# Patient Record
Sex: Male | Born: 1955 | Race: White | Hispanic: No | Marital: Married
Health system: Southern US, Community
[De-identification: ages and names within clinical notes are randomized; demographics above are authoritative.]

---

## 1999-11-24 ENCOUNTER — Encounter: Payer: Self-pay | Admitting: Orthopedic Surgery

## 1999-11-24 ENCOUNTER — Encounter: Payer: Self-pay | Admitting: Emergency Medicine

## 1999-11-26 ENCOUNTER — Inpatient Hospital Stay (HOSPITAL_COMMUNITY): Admission: EM | Admit: 1999-11-26 | Discharge: 1999-11-29 | Payer: Self-pay | Admitting: Emergency Medicine

## 1999-11-27 ENCOUNTER — Encounter: Payer: Self-pay | Admitting: Orthopedic Surgery

## 2000-09-13 ENCOUNTER — Encounter: Payer: Self-pay | Admitting: Orthopedic Surgery

## 2000-09-13 ENCOUNTER — Ambulatory Visit (HOSPITAL_COMMUNITY): Admission: RE | Admit: 2000-09-13 | Discharge: 2000-09-13 | Payer: Self-pay | Admitting: Orthopedic Surgery

## 2000-10-10 ENCOUNTER — Encounter: Payer: Self-pay | Admitting: Orthopedic Surgery

## 2000-10-10 ENCOUNTER — Encounter: Admission: RE | Admit: 2000-10-10 | Discharge: 2000-10-10 | Payer: Self-pay | Admitting: Orthopedic Surgery

## 2005-11-21 ENCOUNTER — Emergency Department (HOSPITAL_COMMUNITY): Admission: EM | Admit: 2005-11-21 | Discharge: 2005-11-22 | Payer: Self-pay | Admitting: Emergency Medicine

## 2006-03-17 ENCOUNTER — Emergency Department (HOSPITAL_COMMUNITY): Admission: EM | Admit: 2006-03-17 | Discharge: 2006-03-18 | Payer: Self-pay | Admitting: Emergency Medicine

## 2006-03-30 ENCOUNTER — Ambulatory Visit (HOSPITAL_COMMUNITY): Admission: RE | Admit: 2006-03-30 | Discharge: 2006-03-30 | Payer: Self-pay | Admitting: Plastic Surgery

## 2006-08-20 ENCOUNTER — Ambulatory Visit: Payer: Self-pay | Admitting: Family Medicine

## 2006-08-22 ENCOUNTER — Ambulatory Visit (HOSPITAL_COMMUNITY): Admission: RE | Admit: 2006-08-22 | Discharge: 2006-08-22 | Payer: Self-pay | Admitting: Internal Medicine

## 2006-08-27 ENCOUNTER — Ambulatory Visit: Payer: Self-pay | Admitting: Family Medicine

## 2006-08-28 ENCOUNTER — Ambulatory Visit: Payer: Self-pay | Admitting: *Deleted

## 2006-10-09 ENCOUNTER — Ambulatory Visit: Payer: Self-pay | Admitting: Family Medicine

## 2007-03-22 ENCOUNTER — Ambulatory Visit: Payer: Self-pay | Admitting: Family Medicine

## 2007-03-22 ENCOUNTER — Encounter (INDEPENDENT_AMBULATORY_CARE_PROVIDER_SITE_OTHER): Payer: Self-pay | Admitting: Internal Medicine

## 2007-03-22 ENCOUNTER — Ambulatory Visit (HOSPITAL_COMMUNITY): Admission: RE | Admit: 2007-03-22 | Discharge: 2007-03-22 | Payer: Self-pay | Admitting: Family Medicine

## 2007-03-22 LAB — CONVERTED CEMR LAB
Basophils Relative: 1 % (ref 0–1)
Eosinophils Absolute: 0.2 10*3/uL (ref 0.0–0.7)
Eosinophils Relative: 3 % (ref 0–5)
HCT: 42.5 % (ref 39.0–52.0)
Lymphs Abs: 2.6 10*3/uL (ref 0.7–3.3)
MCHC: 32.7 g/dL (ref 30.0–36.0)
MCV: 88.5 fL (ref 78.0–100.0)
Monocytes Absolute: 0.9 10*3/uL — ABNORMAL HIGH (ref 0.2–0.7)
Monocytes Relative: 10 % (ref 3–11)
Neutrophils Relative %: 56 % (ref 43–77)
RBC: 4.8 M/uL (ref 4.22–5.81)
WBC: 8.6 10*3/uL (ref 4.0–10.5)

## 2007-04-22 ENCOUNTER — Encounter (INDEPENDENT_AMBULATORY_CARE_PROVIDER_SITE_OTHER): Payer: Self-pay | Admitting: Family Medicine

## 2007-04-22 ENCOUNTER — Ambulatory Visit: Payer: Self-pay | Admitting: Internal Medicine

## 2007-04-22 LAB — CONVERTED CEMR LAB: Valproic Acid Lvl: 55.7 ug/mL (ref 50.0–100.0)

## 2007-05-01 ENCOUNTER — Encounter (INDEPENDENT_AMBULATORY_CARE_PROVIDER_SITE_OTHER): Payer: Self-pay | Admitting: *Deleted

## 2007-06-13 DIAGNOSIS — E669 Obesity, unspecified: Secondary | ICD-10-CM

## 2007-06-13 DIAGNOSIS — R569 Unspecified convulsions: Secondary | ICD-10-CM

## 2007-06-13 DIAGNOSIS — S82899A Other fracture of unspecified lower leg, initial encounter for closed fracture: Secondary | ICD-10-CM

## 2008-01-22 ENCOUNTER — Ambulatory Visit: Payer: Self-pay | Admitting: Cardiology

## 2008-01-22 ENCOUNTER — Ambulatory Visit: Payer: Self-pay | Admitting: Internal Medicine

## 2008-01-22 ENCOUNTER — Inpatient Hospital Stay (HOSPITAL_COMMUNITY): Admission: AC | Admit: 2008-01-22 | Discharge: 2008-01-30 | Payer: Self-pay

## 2008-01-23 ENCOUNTER — Encounter (INDEPENDENT_AMBULATORY_CARE_PROVIDER_SITE_OTHER): Payer: Self-pay | Admitting: Internal Medicine

## 2008-01-30 ENCOUNTER — Encounter: Payer: Self-pay | Admitting: Critical Care Medicine

## 2008-02-11 ENCOUNTER — Ambulatory Visit: Payer: Self-pay | Admitting: Internal Medicine

## 2010-04-30 IMAGING — CT CT HEAD W/O CM
1 series · 16 of 30 positions shown, 20 images · non-contrast
Comparison: 01/22/2008

CLINICAL DATA: Seizure.  Possible head trauma.  Ventilator support.

CT HEAD WITHOUT CONTRAST
TECHNIQUE: Contiguous axial images were obtained from the base of
the skull through the vertex without contrast.

[Series 2: head routine 4.8 h37s · axial · 0.43mm/px · z∈[-109,+22]mm · 16 of 30 slices shown, 20 images]
[im 2/30  brain]
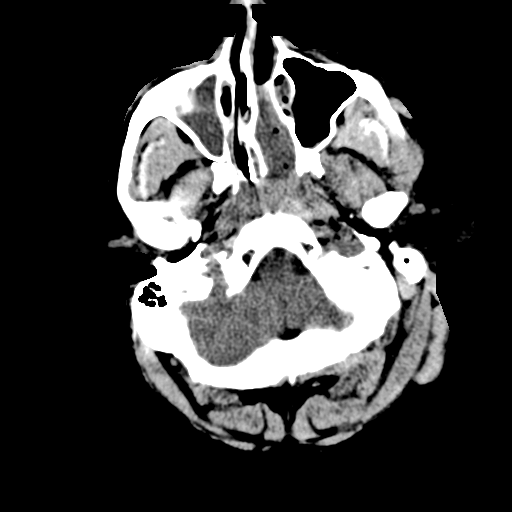
[im 2/30  bone]
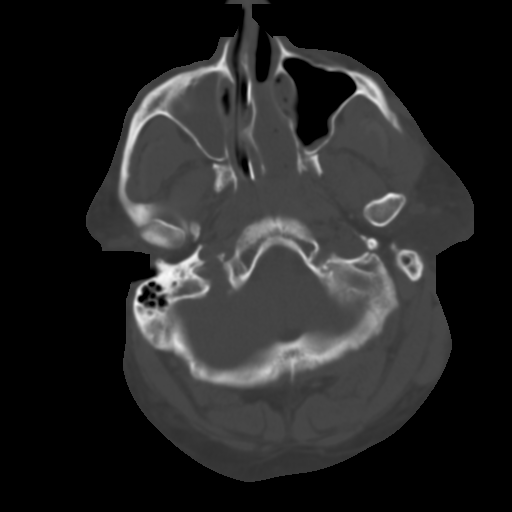
[im 4/30  brain]
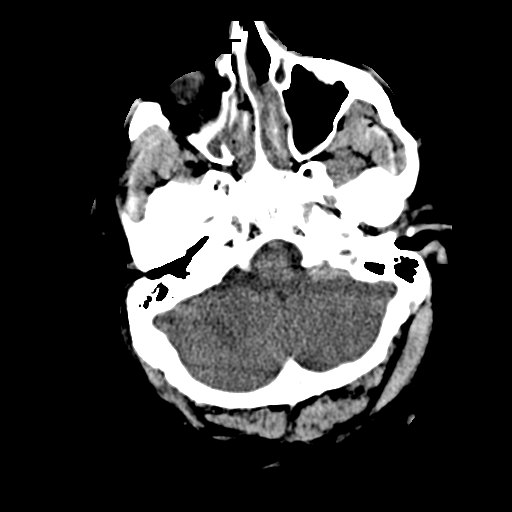
[im 6/30  brain]
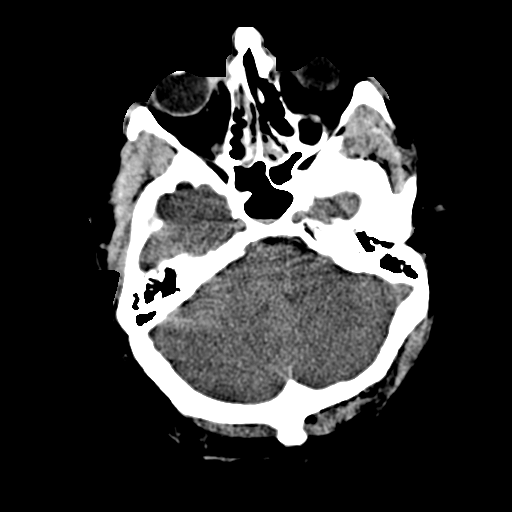
[im 8/30  brain]
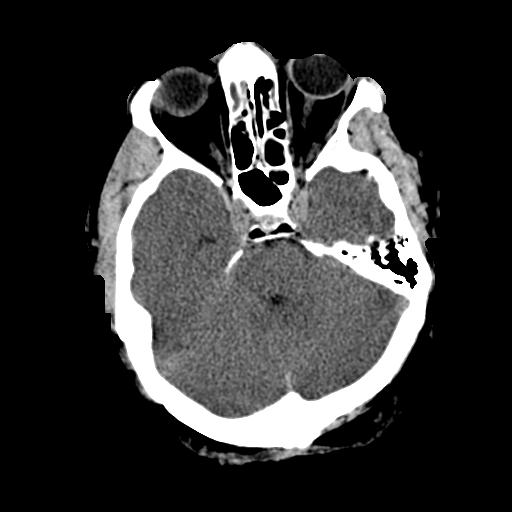
[im 9/30  brain]
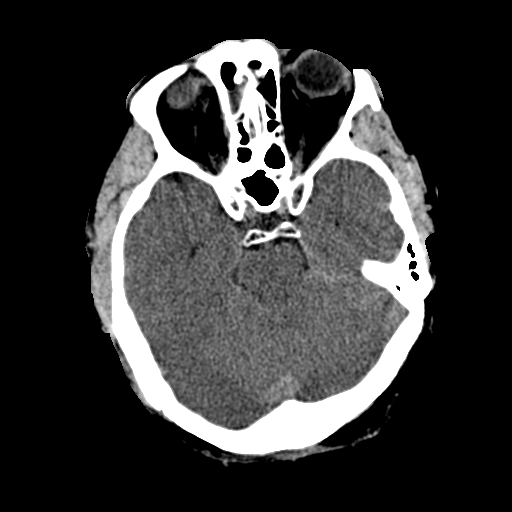
[im 9/30  bone]
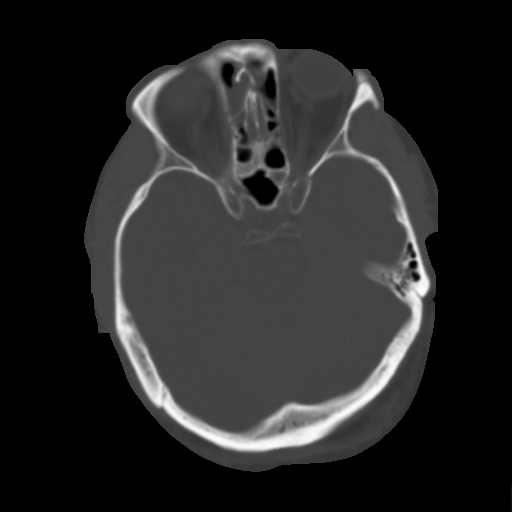
[im 11/30  brain]
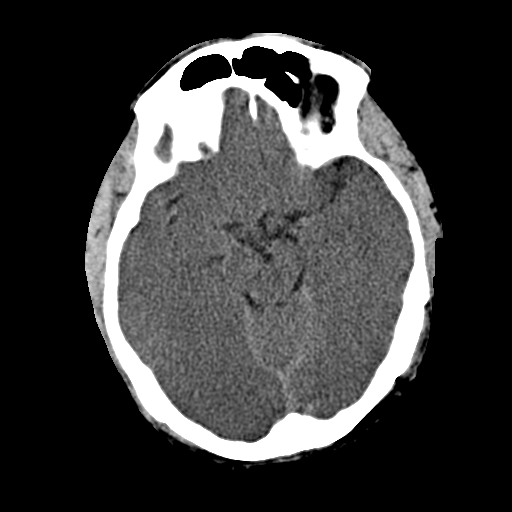
[im 13/30  brain]
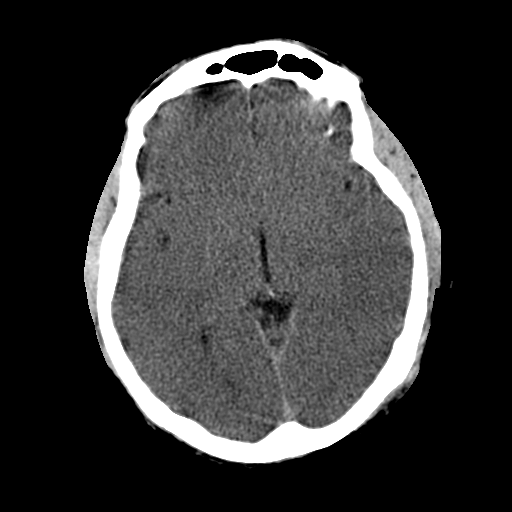
[im 15/30  brain]
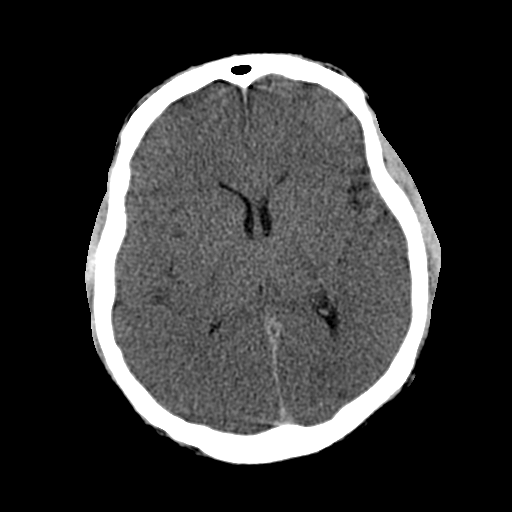
[im 16/30  brain]
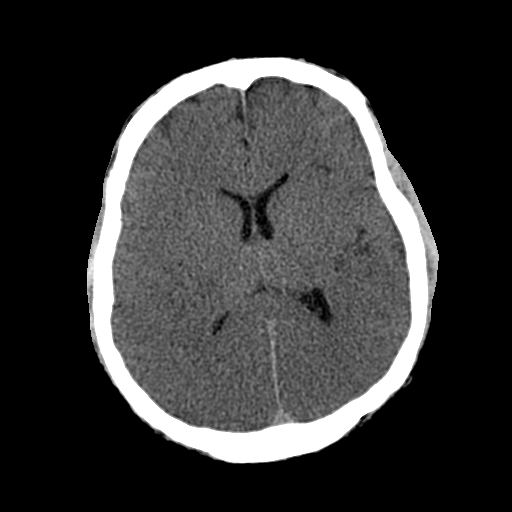
[im 16/30  bone]
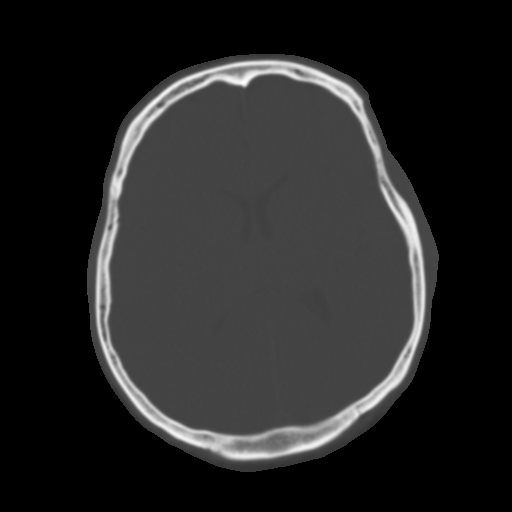
[im 18/30  brain]
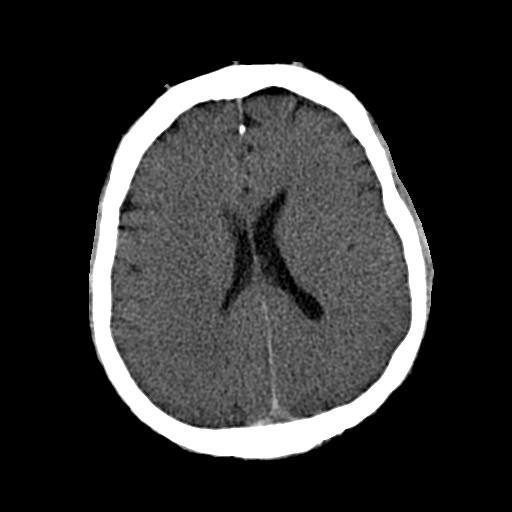
[im 20/30  brain]
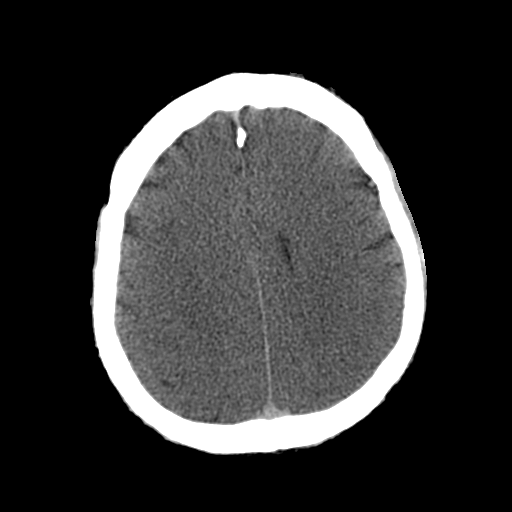
[im 22/30  brain]
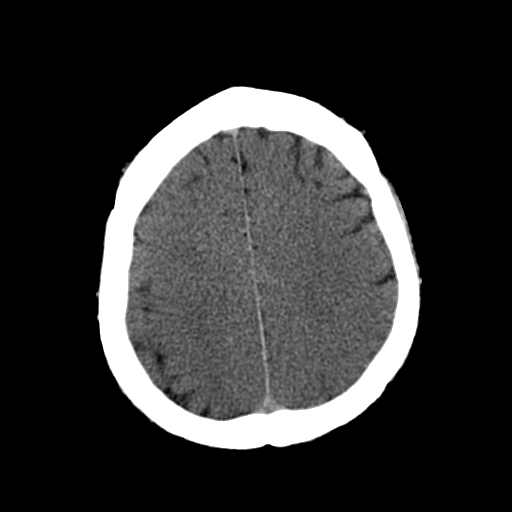
[im 23/30  brain]
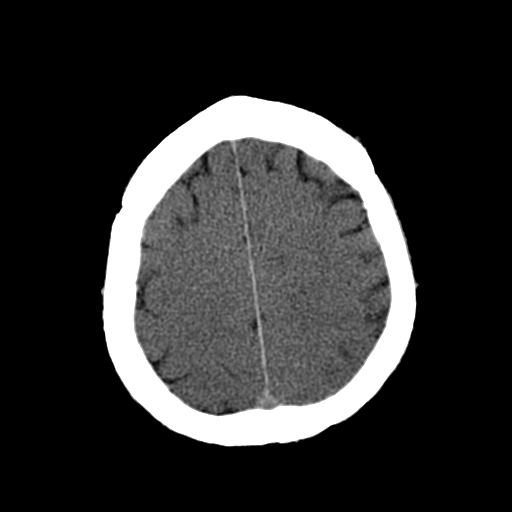
[im 23/30  bone]
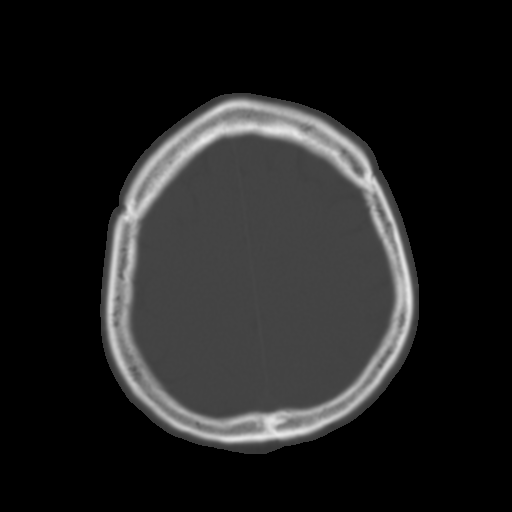
[im 25/30  brain]
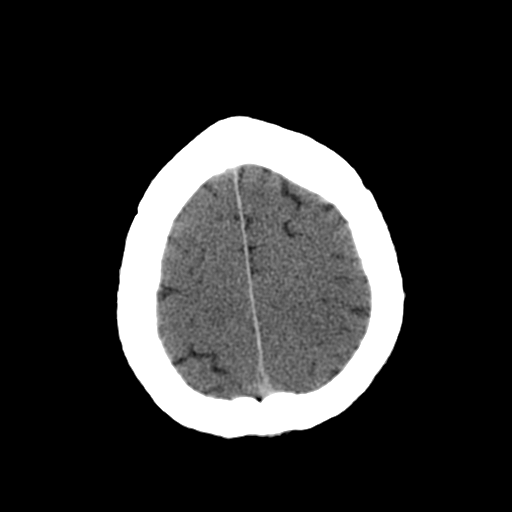
[im 27/30  brain]
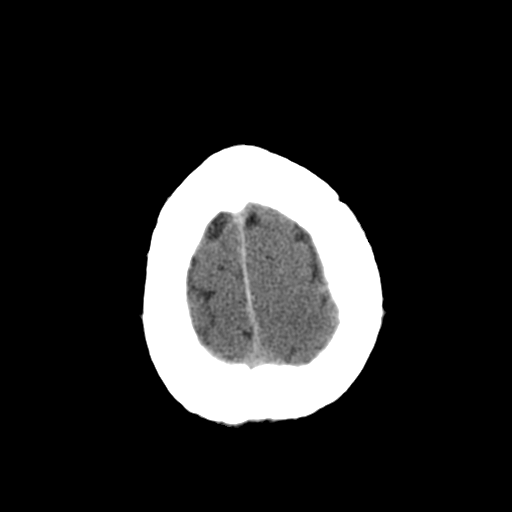
[im 29/30  brain]
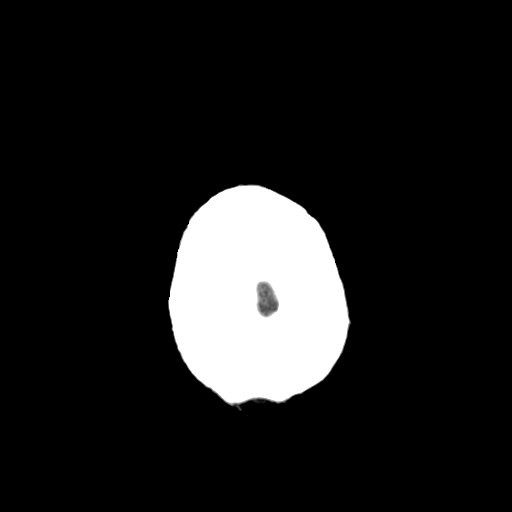

[16 of 30 positions shown; findings below may reference images not displayed]

FINDINGS: There is the possibility of diffuse brain swelling.  I do
not see focal infarction, mass lesion, hemorrhage, hydrocephalus or
extra-axial collection.  Fluid is present in the right maxillary
and ethmoid sinuses.
IMPRESSION: Suspicion of diffusely swollen brain.  This raises the possibility
of diffuse anoxic injury.

## 2010-05-01 IMAGING — CR DG CHEST 1V PORT
1 series · 1 of 1 positions shown · non-contrast
Comparison: 01/23/2008.

CLINICAL DATA: Seizure.  Respiratory failure.

PORTABLE CHEST - 1 VIEW

[view not recorded]
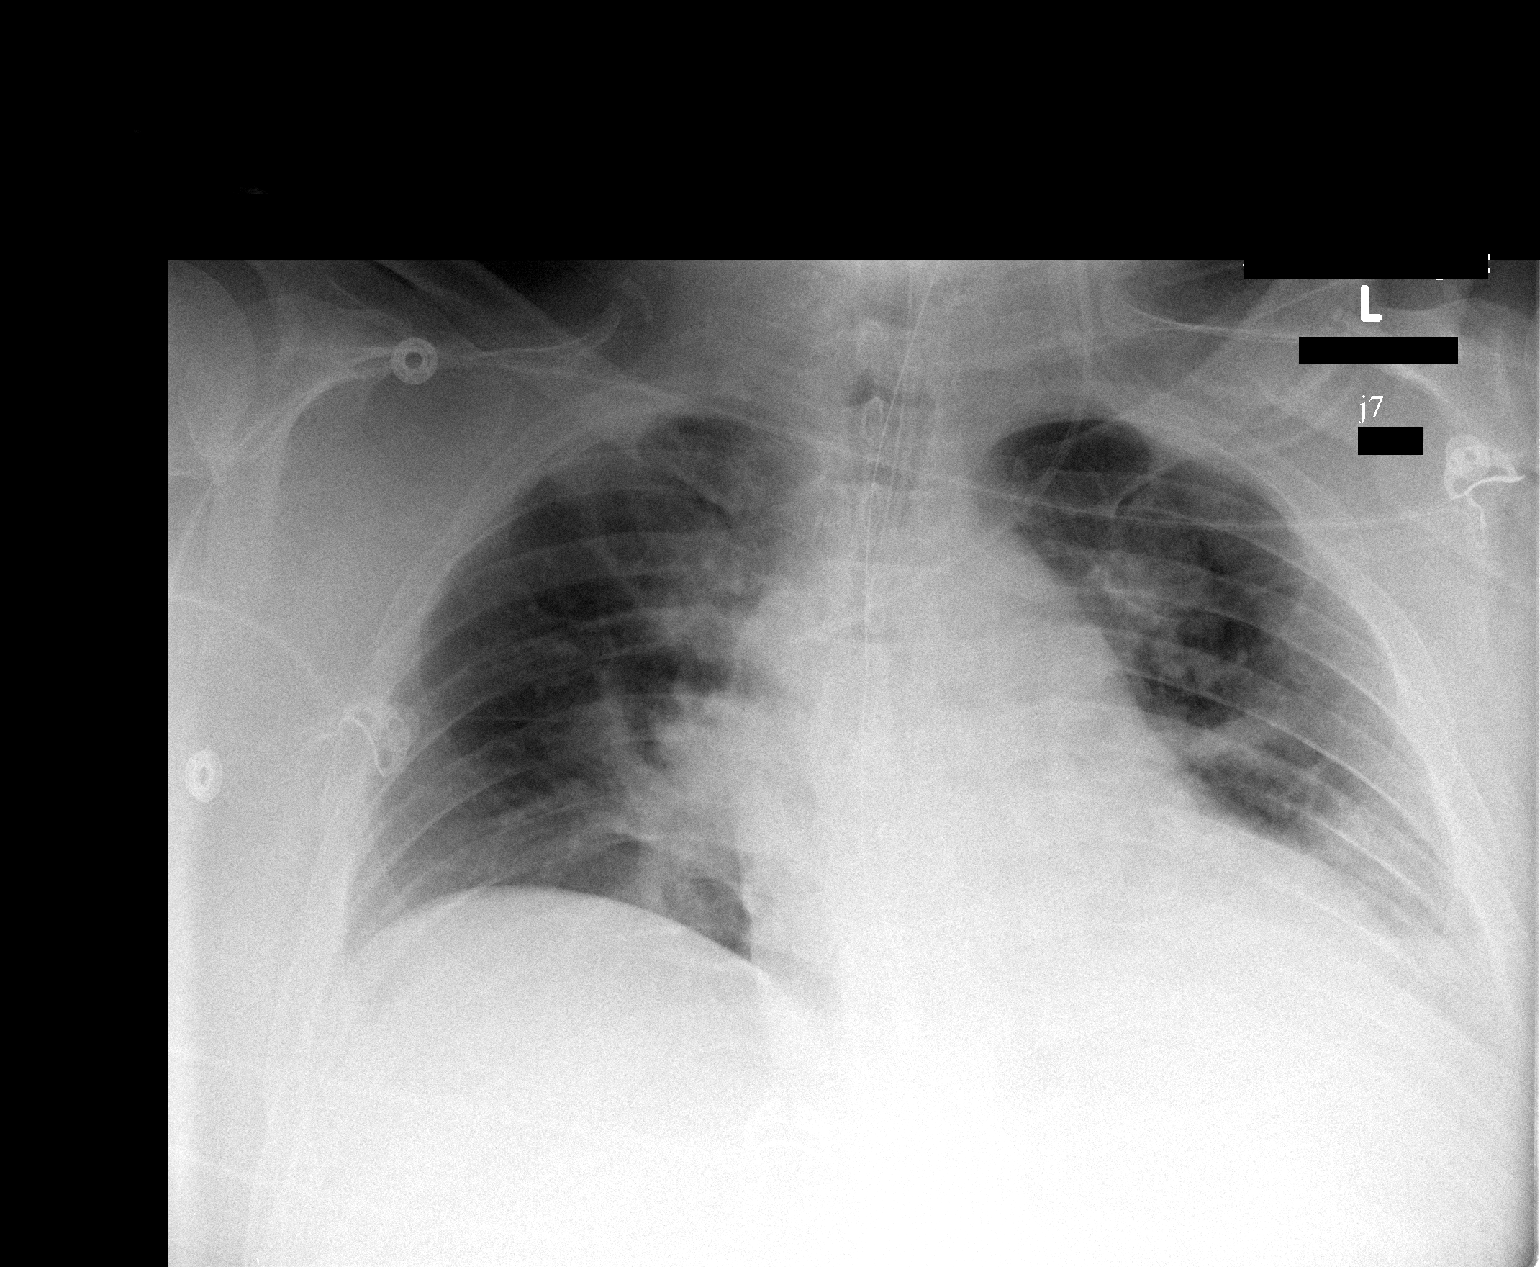

[1 of 1 positions shown; findings below may reference images not displayed]

FINDINGS: Endotracheal tube tip 2.5 cm above the carina.
Cardiomegaly.  Pulmonary vascular congestion.  Subsegmental
atelectatic changes left midlung zone, right perihilar region and
left base.  Limit evaluation of the left base. Left central line
tip proximal superior vena cava.  Nasogastric tube courses below
the diaphragm.
IMPRESSION: Poor inspiration with cardiomegaly and central pulmonary vascular
prominence.

Scattered subsegmental atelectatic changes.

## 2010-05-06 IMAGING — CR DG CHEST 1V PORT
1 series · 1 of 1 positions shown · non-contrast
Comparison: 01/25/2008.

CLINICAL DATA: Seizure, respiratory failure.

PORTABLE CHEST - 1 VIEW

[view not recorded]
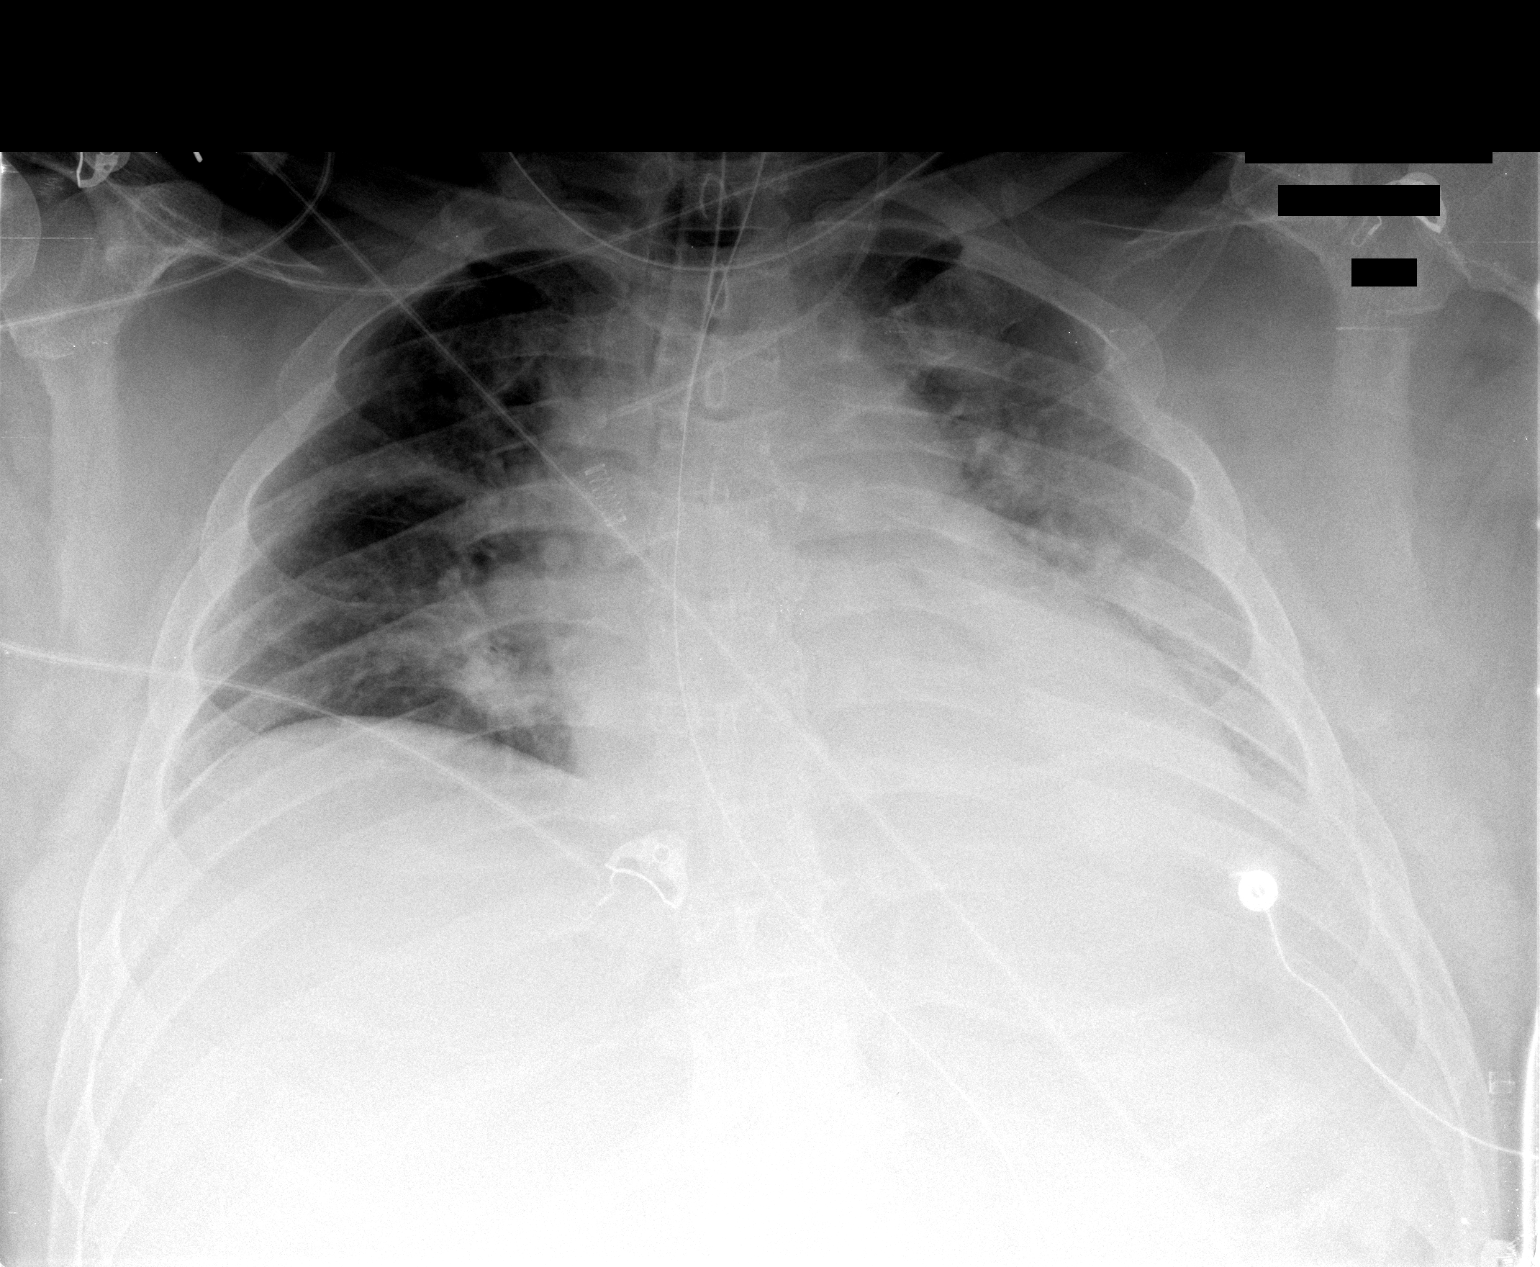

[1 of 1 positions shown; findings below may reference images not displayed]

FINDINGS: Endotracheal tube terminates approximately 1.8 cm above
the carina.  Nasogastric tube is followed into the stomach with the
tip projecting beyond the inferior boundary the film.  Left IJ
central line is at the junction of the brachiocephalic veins.
Lungs are low in volume with persistent bilateral air space
disease.
IMPRESSION: 1.  Low lung volumes with persistent bilateral air space disease,
possibly representing edema.

## 2010-12-27 NOTE — H&P (Signed)
NAME:  Walter Marks, FANT NO.:  [id number]   MEDICAL RECORD NO.:  [id number]          PATIENT TYPE:  INP   LOCATION:  3102                         FACILITY:  [facility]   PHYSICIAN:  Nelda Bucks, MD DATE OF BIRTH:  07/30/1956   DATE OF ADMISSION:  01/22/2008  DATE OF DISCHARGE:                              HISTORY & PHYSICAL   CHIEF COMPLAINT:  Seizure, respiratory failure, status post cardiac  arrest.   HISTORY OF PRESENT ILLNESS:  This is primarily obtained through  emergency room records, and discussion with emergency room physician,  Dr. Bernette Mayers, who obtained primary history from wife.  Apparently, the  patient was in his usual state of health with some complaint of chronic  leg pain until last night.  This morning, the mother heard the patient  fall at home.  She found him unconscious and seizing on the floor.  She  called EMS.  Upon EMS arrival, the patient was apneic and asystolic.  He  was intubated, given 2 doses of epinephrine intravenously, and  plus/minus chest compressions were obtained; however, records at this  time do not verify whether or not he required chest compression.  There  was return of spontaneous circulation, again we are unclear about how  long the patient was asystolic, the estimated time from which the  patient was found seizing and upon arrival to EMS was 10 units.  Upon  arrival in the emergency room, the patient was found to be hypotensive,  continuing to actively have seizure versus myoclonus, and therefore was  treated with IV benzodiazepines in the form of 4 mg of Ativan and 6 mg  of Versed.  He did receive a neuromuscular blockade to facilitate CT of  head.  Upon Pulmonary Critical Care arrival, the patient has just  returned from CT scan which is negative for acute hemorrhage or other  abnormal findings with the exception of sinusitis.  Upon arrival, the  patient had active lower extremity and upper extremity twitching,  facial  twitch, and persistent hypotension.  The Pulmonary Critical Care team  was asked to evaluate and assume care.   PAST MEDICAL HISTORY:  Seizure disorder only.  The last EEG we have was  read as normal in January 2008.   MEDICATIONS:  Currently listed are Depakote.  No other medications.   ALLERGIES:  No known drug allergies.   FAMILY HISTORY:  Not available currently.   REVIEW OF SYSTEMS:  He is unresponsive, on full ventilatory support, and  appears to be actively seizing.   PHYSICAL EXAMINATION:  VITAL SIGNS:  Temperature is pending, heart rate  116, blood pressure 111/57 on dopamine drip, respirations 16, and  saturations 90s.  GENERAL: This is an unresponsive, morbidly obese white male patient  currently on full ventilatory support and actively seizing.  HEENT:  His pupils are equal and reactive to light.  He has no JVD.  He  is orally intubated, mucous membranes are moist.  PULMONARY:  Notable  for scattered rhonchi.  Currently on full ventilatory support.  Tidal  volume 650, respirations 16, and minute  ventilation with this is  approximately 10 L per minute.  PEEP is 5, FIO2 50%, peak inspiratory  pressure is 32+, and expiratory pressure is 24.  CARDIAC:  Regular rate and rhythm.  ABDOMEN:  Soft and nontender.  NEUROLOGIC:  Unresponsive, appears to be actively seizing versus having  active myoclonus.  GU:  Has Foley catheter in place.   LABORATORY DATA:  Sodium 138, potassium 4.5, chloride 106, CO2 10, BUN  23, creatinine 1.32, and glucose 349.  White blood cell count 17.5,  hemoglobin 12.3, hematocrit 37.1, and platelet count 258.  PTT 35 and  PT/INR is 16.2/1.3.  Valproic acid is 21.1, Dilantin level is less than  2.5, and aspirin less than 4.  Chest x-ray demonstrates chest x-ray in  good position.  No clear infiltrates.   IMPRESSION AND PLAN:  1. This is that of status epilepticus with known history of seizure      disorder.  Neurology has been called.   Certainly, given cardiac      arrest, there is worry for anoxic injury contributing to what also      might be active myoclonus instead of actual seizure disorder.  Plan      for this is to initiate Versed drip, lower valproic acid, obtain      STAT EEG, check CKs and myoglobin, and consider repeat CT of head      should seizures persist.  Given altered mental status, and active      shock state, and requiring vasoactive drips, we will forego lumbar      puncture at this time, and treat empirically with antibiotics to      cover potentially for meningitis, however, at low clinical      suspicion.  2. Status post cardiopulmonary arrest, unclear of downtime.  The      initial cardiac rhythm was asystole, has had persistent shock state      since this, requiring vasoactive support.  He did have a transient      junctional rhythm status post arrival to the emergency room.      Certainly, additional potential causes could be acute coronary      artery syndrome, also would question whether or not there could      have been acute pulmonary emboli given the patient did complain of      leg pain the night prior, although not clinically high suspicion,      would worry about deep vein thrombosis.  Therefore, plan is to      check cardiac enzymes, check BNP, and check 12-lead again.  I have      ordered echocardiogram to evaluate right ventricular strain.  We      will check bilateral lower extremity Dopplers, obtain CT of chest      to rule out PE, place central venous catheter, and aggressively      hydrate for goal-directed therapy.  At this time, probable      metabolic status is contributing to hemodynamic compromise.  We      will obtain arterial blood gas, check ABG, and resuscitate as      indicated in the intensive care.  3. Hyperglycemia.  Plan for this is to initiate hyperglycemia      protocol.  4. Acute respiratory failure, secondary to status epilepticus and      cardiopulmonary  arrest.  Currently, no clear evidence of pneumonia,      but high risk for aspiration.  Therefore, we  will check      bronchioalveolar lavage, continue full support, consider      bronchodilators, and follow up arterial blood gas and chest x-ray.  5. Metabolic acidosis, likely secondary to hypoperfusion state.  Plan      for this is to support end-organ perfusion, hydrate, check lactic      acid and arterial blood gas as well as repeat electrolyte panel.  6. Leukocytosis, question serous versus sepsis.  Does have      radiographic evidence of sinusitis, however, no other clear source      of infection.  We are going to empirically cover for potential      meningitis at low clinical suspicion given history of active      seizure disorder.  In anyway, plan will be to blood culture x2,      obtain bronchioalveolar lavage, and urine culture.  We will      empirically cover with ceftriaxone 4 tablets and vancomycin.  Best      practice plan for this is to place the patient on subcu heparin and      proton pump inhibitors.      Zenia Resides, NP      Nelda Bucks, MD  Electronically Signed    PB/MEDQ  D:  01/22/2008  T:  01/23/2008  Job:  [id number]

## 2010-12-27 NOTE — Consult Note (Signed)
NAME:  Walter Marks, Walter Marks NO.:  [id number]   MEDICAL RECORD NO.:  [id number]          PATIENT TYPE:  INP   LOCATION:  3102                         FACILITY:  [facility]   PHYSICIAN:  Wilson Singer, M.D.DATE OF BIRTH:  05-04-1956   DATE OF CONSULTATION:  01/27/2008  DATE OF DISCHARGE:                                 CONSULTATION   REFERRING PHYSICIAN:  Mcarthur Rossetti. Tyson Alias, MD, Critical Care.   CONSULTING PHYSICIAN:  Wilson Singer, MD   IMPRESSION:  1. 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  2. Palliative performance score 10%.  3. Prognosis very poor for survival and/poor neurological recovery.   RECOMMENDATIONS:  1. I discussed the pathophysiology of current condition at length and      families, especially and sister and mother, belief systems.  2. Continue full/active treatment for the time being, and I will      continue to follow up.  The wife has a legal right to make final      medical decisions.   HISTORY:  This is a 55 year old man who had a seizure approximately 5  days ago followed by asystolic arrest and associated anoxic brain  injury.  The mother who was at home relates that on the day of admission  at 2:40 p.m. the patient was talking on the phone suddenly she heard a  cessation of the conversation and a noise, which led her to believe that  he had fallen.  When she went to see him in his room, he was unconscious  blue in the face and initially breathing but then soon after stopped  breathing.  She had immediately called 911 and the paramedics arrived  and started cardiopulmonary resuscitation.  She believes that it was a  good 10 minutes or so prior to the paramedics arriving at the scene and  from the medical records it appears that he continued to have status  epilepticus at the home and also in the emergency room.  A CT scanning  and EEG confirms the presence of significant cerebral  edema and brain  wave activity, which is consistent with severe brain injury.  Neurologists have reviewed the patient's face and feel that the patient  has a very poor prognosis and Dr. Orlin Hilding, in particular feels that this  is not a survivable condition.  He is currently on a mechanical  ventilator and has nasogastric tube feeding and is currently on  intervenous fluid support, antibiotic support, and pressors as required.  A CT scan and chest x-rays are suggestive also of infiltrates in lung  fields consistent with pneumonia.  We are now asked to consult in  regarding goals of care.   PAST MEDICAL HISTORY:  1. Previous history of seizure disorders since the age of 80 and the      patient apparently had been probably noncompliant with medications      according to the mother.  2. No other significant history of note.   CURRENT MEDICATIONS:  In the intensive care include;  1. Heparin  5000 units subcutaneously every 8 hours.  2. Protonix 40 mg IV nightly.  3. Colace 100 mg b.i.d.  4. Valproate 1500 mg every 12 hours IV.  5. Keppra 1000 mg every 12 hours IV.  6. Ceftriaxone 1g every 24 hours IV.  7. Jevity tube feeding at 80 mL an hour and normal saline at 50 mL an      hour.  8. Norepinephrine intravenously as required to maintain blood      pressure.   ALLERGIES:  None.   REVIEW OF SYSTEMS:  The patient is unable to give me any history.   PHYSICAL EXAMINATION:  GENERAL:  The patient is on a ventilator and  peripheries are warm at the present time.  HEENT:  Pupils appeared to fixed and not reacting to light.  CHEST:  Heart sounds are present and normal lung fields.  Anterior  appear to be clear.  ABDOMEN:  Soft.  NEUROLOGICAL:  As mentioned above.  Pupils are fixed.  There is no  response to pain.  There appears to be a small withdrawal reflex on  plantar stimulation.  Plantars are essentially unresponsive.   RELEVANT DATA:  The CT scan and EEG findings as described above.   Also a  CT chest scan confirms presence of bilateral infiltrates probably  consistent with pneumonia.   DISCUSSION:  This unfortunate 55 year old man has a severe anoxic brain  injury and is unlikely to survive and even unlikely to have any  neurological recovery even if he does survive with the ventilator.  I  have explained the pathophysiology to the family and it appears from the  conversations that the wife who has the legal right to make medical  decisions is on the side of withdrawing active and aggressive treatment  where as the sister and the mother at the present time are undecided  about what to do and his sister in particular feels that she expects  miracle to occur and that for the patient to walk out of the hospital.  We will follow the patient and the family along with you.   Time spent was 90 minutes, more than 50% of which was involved in  counseling and coordination of care.      Wilson Singer, M.D.  Electronically Signed     NCG/MEDQ  D:  01/27/2008  T:  01/28/2008  Job:  [id number]
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
## 2010-12-27 NOTE — Procedures (Signed)
REFERRING PHYSICIAN:  Mcarthur Rossetti. Tyson Alias, M.D.   CLINICAL HISTORY:  A 55 year old patient being found unresponsive.   MEDICATIONS:  Ativan, Protonix, heparin, vancomycin, Levophed, Versed,  Depacon, Flagyl, Rocephin, and Depakote.   This is a portable EEG recorded with the patient described as being  unresponsive using standard 10/20 electrode placement on a 17-channel  machine.   The background severely suppressed and not clearly seen.  Intermittent  high-amplitude paroxysmal discharges are noted lasting 1-3 seconds  interspersed with periods of suppression of the background, which last 3-  5 seconds.  These paroxysmals appeared to be epileptiform in nature.  No  normal or awake portions of the tracing are noted.  Length of the  recording is 22.9 minutes.  Technical component is average.  EKG tracing  reveals regular sinus tachycardia, hyperventilation, and photic  stimulation not performed.   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           ______________________________  Sunny Schlein. Pearlean Brownie, MD     ZOX:WRUE  D:  01/23/2008 18:39:53  T:  01/23/2008 23:37:39  Job #:  [id number]

## 2010-12-30 NOTE — Discharge Summary (Signed)
NAME:  Walter Marks, Walter Marks NO.:  [id number]   MEDICAL RECORD NO.:  [id number]          PATIENT TYPE:  INP   LOCATION:  3102                         FACILITY:  [facility]   PHYSICIAN:  Charlcie Cradle. Delford Field, MD, FCCPDATE OF BIRTH:  Aug 05, 1956   DATE OF ADMISSION:  01/22/2008  DATE OF DISCHARGE:  01/30/2008                               DISCHARGE SUMMARY   DATE OF DEATH:  02/19/08.   DEATH DIAGNOSES:  1. Status epilepticus with subsequent ischemic brain injury and severe      brain injury with subsequent brain death.  2. Acute respiratory failure, failure to wean.  3. Aspiration pneumonia.  4. Cardiac arrest.  5. Hypokalemia, hypocalcemia.  6. Hepatic dysfunction, shock liver.   HISTORY OF PRESENT ILLNESS:  See history and physical on the chart.   HOSPITAL COURSE:  This is a 55 year old initially admitted to the  Intensive Care Unit after status epilepticus and seizures.  The patient  was placed on full vent support, intubated on January 22, 2008.  Despite 8  days of aggressive care, there was evidence of progression of the  patient's neurologic status to the point of brain death.  Neurology was  consulted, they confirmed this.  Further discussions were held with the  family, and we elected to pursue more a comfort care approach.  The  patient's family declined organ donation and on January 30, 2008,  they  agreed to discontinue the vent.  The patient had been declared brain  dead on 02/19/2008, at 4:20 p.m., but the patient's heart stopped at  8:15 a.m. on January 30, 2008.  There will be no autopsy.      Charlcie Cradle Delford Field, MD, [facility]  Electronically Signed     PEW/MEDQ  D:  02/18/2008  T:  02/19/2008  Job:  [id number]
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
## 2010-12-30 NOTE — Procedures (Signed)
EEG NUMBER:  [id number].   CLINICAL INFORMATION:  This patient is being evaluated for jerks  occurring since age 55.   TECHNICAL DESCRIPTION:  This EEG was recorded during the awake state  with 10 Hz rhythms of the background.  There is no evidence of any focal  asymmetry.  Eye-opening and eye-closing maneuvers had the appropriate  response on the background activity.  No stage II sleep was noted.  Photic stimulation was performed, which produced a driving response in  the occipital head regions.  No photic myoclonic or photoconvulsive  response was noted.  Hyperventilation testing produced no abnormalities.   IMPRESSION:  This is a normal EEG during the awake state.           ______________________________  Genene Churn. Sandria Manly, M.D.     EAV:[id number]  D:  08/22/2006 21:11:57  T:  08/23/2006 07:05:11  Job #:  [id number]

## 2011-05-11 LAB — BASIC METABOLIC PANEL
BUN: 20
BUN: 47 — ABNORMAL HIGH
BUN: 14
BUN: 21
BUN: 23
CO2: 17 — ABNORMAL LOW
CO2: 32
CO2: 34 — ABNORMAL HIGH
Calcium: 6.2 — CL
Calcium: 8 — ABNORMAL LOW
Chloride: 113 — ABNORMAL HIGH
Chloride: 120 — ABNORMAL HIGH
Chloride: 122 — ABNORMAL HIGH
Chloride: 124 — ABNORMAL HIGH
Creatinine, Ser: 3.74 — ABNORMAL HIGH
Creatinine, Ser: 0.94
Creatinine, Ser: 1.01
Creatinine, Ser: 1.11
GFR calc Af Amer: >60
GFR calc Af Amer: >60
GFR calc Af Amer: >60
GFR calc non Af Amer: >60
Glucose, Bld: 233 — ABNORMAL HIGH
Glucose, Bld: 115 — ABNORMAL HIGH
Potassium: 3 — ABNORMAL LOW
Potassium: 3.6

## 2011-05-11 LAB — MAGNESIUM: Magnesium: 2.3

## 2011-05-11 LAB — BLOOD GAS, ARTERIAL
Acid-Base Excess: 6.4 — ABNORMAL HIGH
Acid-Base Excess: 6.6 — ABNORMAL HIGH
Acid-base deficit: 0.5
Acid-base deficit: 6.3 — ABNORMAL HIGH
Bicarbonate: 19.3 — ABNORMAL LOW
Drawn by: 24486
Drawn by: 224301
FIO2: 0.5
FIO2: 0.35
MECHVT: 650
O2 Content: 8
O2 Saturation: 97.2
O2 Saturation: 98.5
PEEP: 10
PEEP: 5
Patient temperature: 98.6
RATE: 14
RATE: 28
RATE: 10
TCO2: 20.2
pCO2 arterial: 30.9 — ABNORMAL LOW
pCO2 arterial: 31.4 — ABNORMAL LOW
pCO2 arterial: 62.4
pH, Arterial: 7.455 — ABNORMAL HIGH
pH, Arterial: 7.331 — ABNORMAL LOW
pO2, Arterial: 175 — ABNORMAL HIGH
pO2, Arterial: 89.5
pO2, Arterial: 92.9
pO2, Arterial: 203 — ABNORMAL HIGH
pO2, Arterial: 75.9 — ABNORMAL LOW

## 2011-05-11 LAB — CBC
HCT: 30.8 — ABNORMAL LOW
HCT: 37.1 — ABNORMAL LOW
HCT: 37.1 — ABNORMAL LOW
HCT: 28.5 — ABNORMAL LOW
HCT: 31.7 — ABNORMAL LOW
Hemoglobin: 10.6 — ABNORMAL LOW
Hemoglobin: 12.3 — ABNORMAL LOW
MCHC: 33.3
MCHC: 33.6
MCV: 88.7
MCV: 87.6
MCV: 88.7
Platelets: 161
Platelets: 256
RBC: 3.6 — ABNORMAL LOW
RBC: 4.18 — ABNORMAL LOW
RBC: 3.22 — ABNORMAL LOW
RBC: 3.62 — ABNORMAL LOW
RDW: 13.7
RDW: 14
WBC: 11 — ABNORMAL HIGH
WBC: 16 — ABNORMAL HIGH
WBC: 11.9 — ABNORMAL HIGH

## 2011-05-11 LAB — COMPREHENSIVE METABOLIC PANEL
ALT: 169 — ABNORMAL HIGH
ALT: 182 — ABNORMAL HIGH
AST: 177 — ABNORMAL HIGH
Albumin: 2.8 — ABNORMAL LOW
Albumin: 3.1 — ABNORMAL LOW
Alkaline Phosphatase: 44
Alkaline Phosphatase: 55
BUN: 23
BUN: 24 — ABNORMAL HIGH
BUN: 25 — ABNORMAL HIGH
CO2: 10 — ABNORMAL LOW
CO2: 24
Calcium: 7.6 — ABNORMAL LOW
Chloride: 113 — ABNORMAL HIGH
Chloride: 113 — ABNORMAL HIGH
Creatinine, Ser: 1.32
GFR calc Af Amer: >60
GFR calc non Af Amer: 57 — ABNORMAL LOW
GFR calc non Af Amer: >60
Glucose, Bld: 136 — ABNORMAL HIGH
Glucose, Bld: 349 — ABNORMAL HIGH
Potassium: 3.8
Potassium: 4
Sodium: 140
Total Bilirubin: 0.7
Total Bilirubin: 0.7
Total Protein: 5.3 — ABNORMAL LOW
Total Protein: 6.1

## 2011-05-11 LAB — CK TOTAL AND CKMB (NOT AT ARMC): Relative Index: 3.6 — ABNORMAL HIGH

## 2011-05-11 LAB — CULTURE, BLOOD (ROUTINE X 2)

## 2011-05-11 LAB — POCT I-STAT 3, ART BLOOD GAS (G3+)
Acid-base deficit: 11 — ABNORMAL HIGH
Acid-base deficit: 6 — ABNORMAL HIGH
Bicarbonate: 15.5 — ABNORMAL LOW
O2 Saturation: 100
O2 Saturation: 85
Operator id: 270221
Patient temperature: 98.7
TCO2: 17
pO2, Arterial: 58 — ABNORMAL LOW

## 2011-05-11 LAB — URINE MICROSCOPIC-ADD ON

## 2011-05-11 LAB — HEPATIC FUNCTION PANEL
AST: 189 — ABNORMAL HIGH
Albumin: 3 — ABNORMAL LOW
Total Bilirubin: 0.6
Total Protein: 5.9 — ABNORMAL LOW

## 2011-05-11 LAB — CULTURE, BAL-QUANTITATIVE W GRAM STAIN

## 2011-05-11 LAB — LIPASE, BLOOD: Lipase: 31

## 2011-05-11 LAB — TRIGLYCERIDES: Triglycerides: 175 — ABNORMAL HIGH

## 2011-05-11 LAB — URINALYSIS, ROUTINE W REFLEX MICROSCOPIC
Leukocytes, UA: NEGATIVE
Nitrite: NEGATIVE
Specific Gravity, Urine: 1.017
Urobilinogen, UA: 0.2

## 2011-05-11 LAB — CORTISOL: Cortisol, Plasma: 34.2

## 2011-05-11 LAB — RAPID URINE DRUG SCREEN, HOSP PERFORMED
Barbiturates: NOT DETECTED
Cocaine: NOT DETECTED
Cocaine: NOT DETECTED
Opiates: NOT DETECTED
Tetrahydrocannabinol: NOT DETECTED

## 2011-05-11 LAB — URINE CULTURE: Culture: NO GROWTH

## 2011-05-11 LAB — CARDIAC PANEL(CRET KIN+CKTOT+MB+TROPI)
CK, MB: 30.2 — ABNORMAL HIGH
CK, MB: 43.2 — ABNORMAL HIGH
Relative Index: 1
Relative Index: 1.8
Total CK: 2372 — ABNORMAL HIGH
Total CK: 3014 — ABNORMAL HIGH
Troponin I: 2.02

## 2011-05-11 LAB — PROTIME-INR: INR: 1.3

## 2011-05-11 LAB — MYOGLOBIN, URINE: Myoglobin, Ur: <27 mcg/L (ref <28)

## 2011-05-11 LAB — PATHOLOGIST SMEAR REVIEW: Tech Review: REACTIVE

## 2011-05-11 LAB — DIFFERENTIAL
Basophils Absolute: 0.4 — ABNORMAL HIGH
Eosinophils Relative: 3
Lymphs Abs: 6.1 — ABNORMAL HIGH
Monocytes Relative: 7
Neutrophils Relative %: 53

## 2011-05-11 LAB — TROPONIN I: Troponin I: 1.15

## 2011-05-11 LAB — APTT: aPTT: 35

## 2011-05-11 LAB — POCT CARDIAC MARKERS: CKMB, poc: 5.9

## 2011-05-11 LAB — PHENYTOIN LEVEL, TOTAL: Phenytoin Lvl: <2.5 — ABNORMAL LOW

## 2011-05-11 LAB — PHOSPHORUS: Phosphorus: 2.2 — ABNORMAL LOW

## 2015-03-04 ENCOUNTER — Telehealth: Payer: Self-pay

## 2015-03-04 NOTE — Telephone Encounter (Signed)
error
